# Patient Record
Sex: Female | Born: 1980
Health system: Southern US, Community
[De-identification: ages and names within clinical notes are randomized; demographics above are authoritative.]

## PROBLEM LIST (undated history)

## (undated) DIAGNOSIS — Z975 Presence of (intrauterine) contraceptive device: Secondary | ICD-10-CM

## (undated) DIAGNOSIS — A599 Trichomoniasis, unspecified: Secondary | ICD-10-CM

## (undated) DIAGNOSIS — E669 Obesity, unspecified: Secondary | ICD-10-CM

## (undated) DIAGNOSIS — R768 Other specified abnormal immunological findings in serum: Secondary | ICD-10-CM

## (undated) DIAGNOSIS — E559 Vitamin D deficiency, unspecified: Secondary | ICD-10-CM

## (undated) DIAGNOSIS — R51 Headache: Secondary | ICD-10-CM

## (undated) DIAGNOSIS — N898 Other specified noninflammatory disorders of vagina: Secondary | ICD-10-CM

## (undated) DIAGNOSIS — Z8619 Personal history of other infectious and parasitic diseases: Secondary | ICD-10-CM

## (undated) HISTORY — DX: Obesity, unspecified: E66.9

## (undated) HISTORY — DX: Headache: R51

## (undated) HISTORY — DX: Personal history of other infectious and parasitic diseases: Z86.19

## (undated) HISTORY — DX: Presence of (intrauterine) contraceptive device: Z97.5

## (undated) HISTORY — DX: Vitamin D deficiency, unspecified: E55.9

## (undated) HISTORY — DX: Other specified noninflammatory disorders of vagina: N89.8

## (undated) HISTORY — DX: Trichomoniasis, unspecified: A59.9

## (undated) HISTORY — DX: Other specified abnormal immunological findings in serum: R76.8

## (undated) HISTORY — PX: TONSILLECTOMY: SUR1361

---
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
## 2002-06-08 ENCOUNTER — Emergency Department (HOSPITAL_COMMUNITY): Admission: EM | Admit: 2002-06-08 | Discharge: 2002-06-08 | Payer: Self-pay | Admitting: Emergency Medicine

## 2002-06-08 ENCOUNTER — Encounter: Payer: Self-pay | Admitting: Emergency Medicine

## 2003-08-19 IMAGING — CT CT ABDOMEN W/ CM
1 of 2 series · 15 of 32 positions shown, 20 images · IV contrast (omnipaque)
Comparison: none

FINDINGS
CLINICAL DATA: ABDOMINAL PAIN TIMES TWO WEEKS.
CT ABDOMEN WITH CONTRAST
ROUTINE CT SCAN OF THE ABDOMEN AND PELVIS WAS PERFORMED FOLLOWING ORAL CONTRAST ADMINISTRATION AND
DURING INFUSION OF 100 CC OMNIPAQUE 300, IV.
NO ABNORMALITIES ARE SEEN IN THE LUNG BASES.
THE LIVER, GALLBLADDER, ADRENAL GLANDS, PANCREAS, SPLEEN, AND KIDNEYS ARE UNREMARKABLE IN
APPEARANCE.  THERE IS NO ENLARGED RETROPERITONEAL LYMPHADENOPATHY OR ASCITES DETECTED.  VISUALIZED
BOWEL LOOPS ARE NORMAL.
IMPRESSION
NEGATIVE CT SCAN OF THE ABDOMEN.
CT PELVIS WITH CONTRAST
THE UTERUS AND ADNEXAL STRUCTURES ARE UNREMARKABLE IN APPEARANCE.  THERE ARE NO SOFT TISSUE MASSES
OR ABNORMAL FLUID COLLECTIONS.  VISUALIZED BOWEL IS UNREMARKABLE IN APPEARANCE.  APPENDIX IS
VISUALIZED.
NEGATIVE CT SCAN OF THE PELVIS.

[Series 1367: — · axial · 0.60mm/px · z∈[+1415,+1815]mm · 15 of 89 slices shown, 20 images]
[im 5/89  soft-tissue]
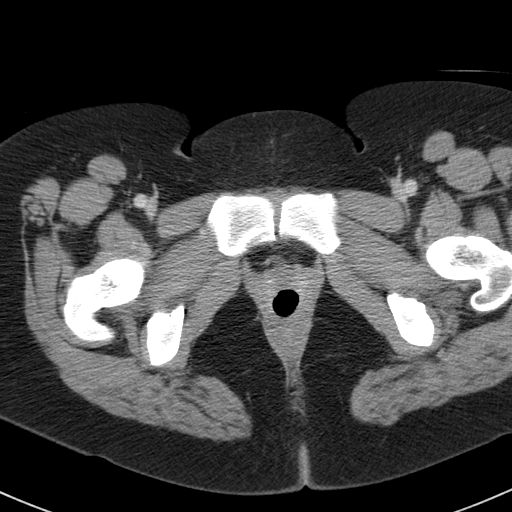
[im 5/89  bone]
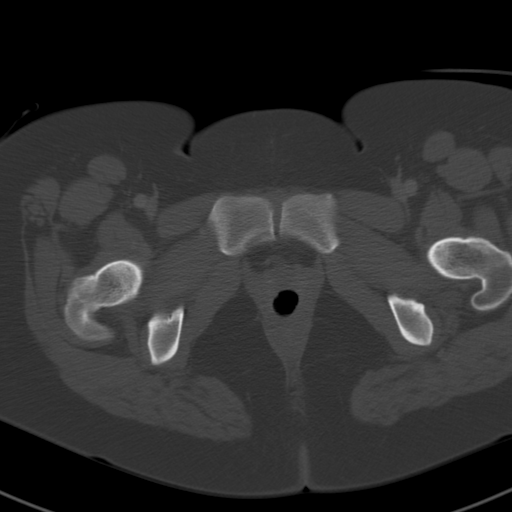
[im 13/89  soft-tissue]
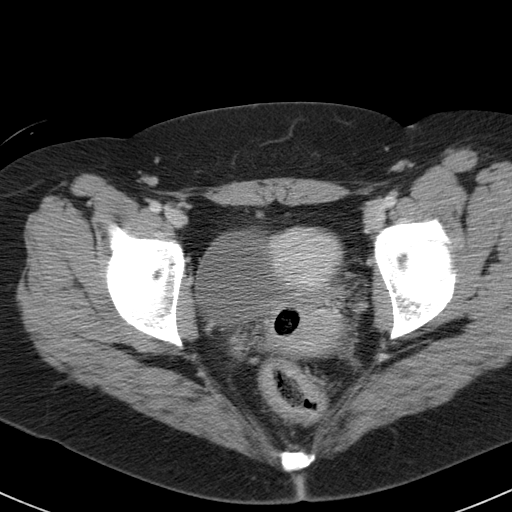
[im 17/89  soft-tissue]
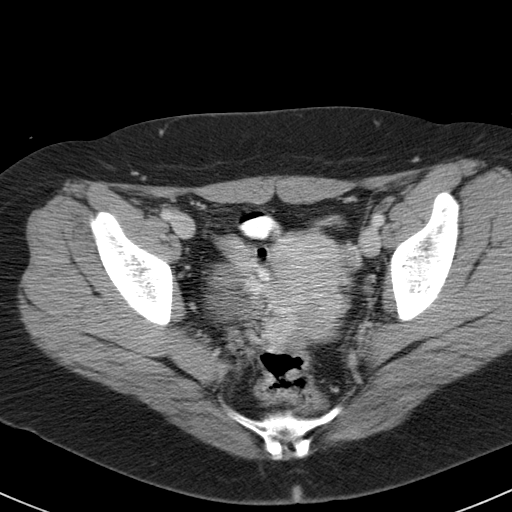
[im 25/89  soft-tissue]
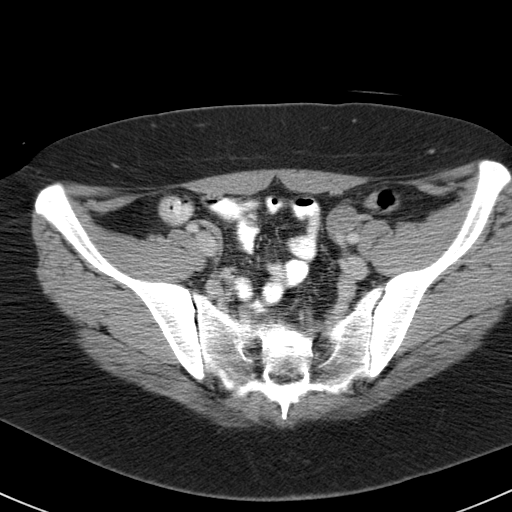
[im 29/89  soft-tissue]
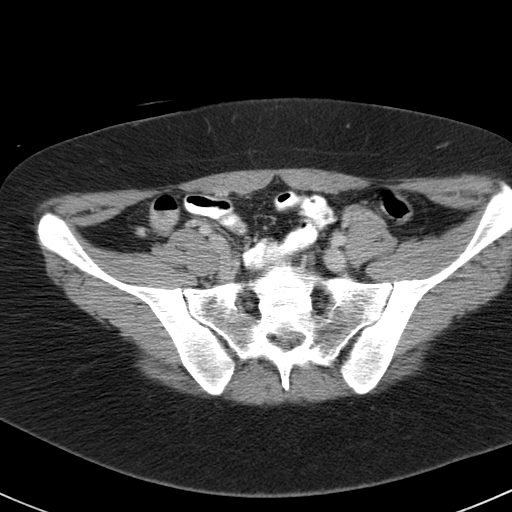
[im 37/89  soft-tissue]
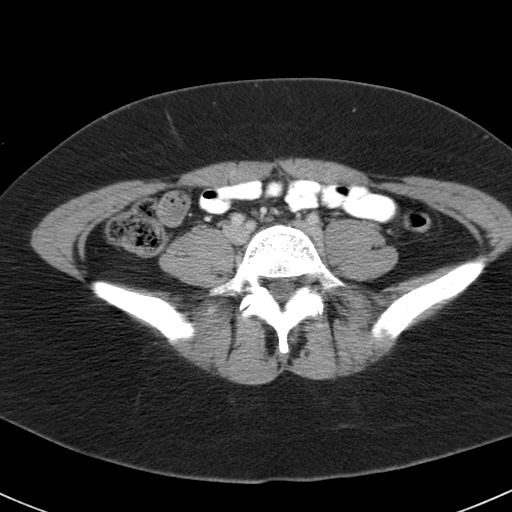
[im 41/89  soft-tissue]
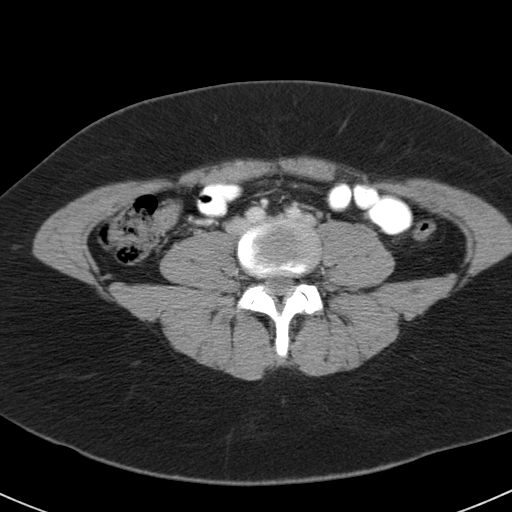
[im 49/89  soft-tissue]
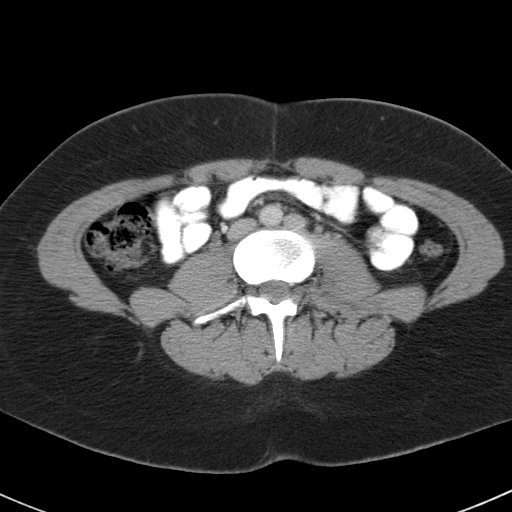
[im 53/89  soft-tissue]
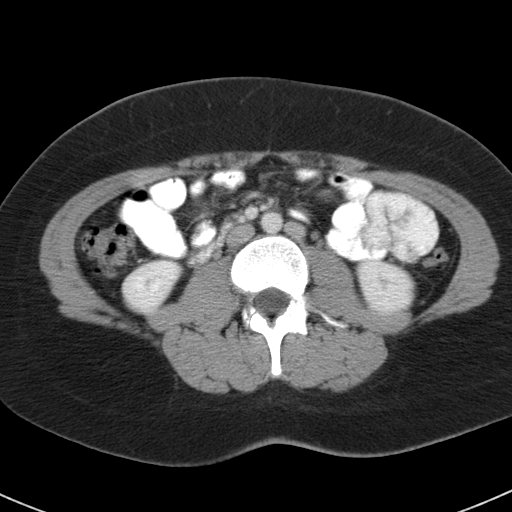
[im 53/89  bone]
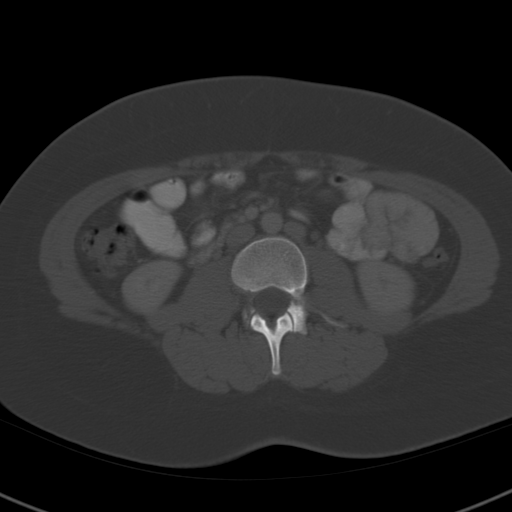
[im 61/89  soft-tissue]
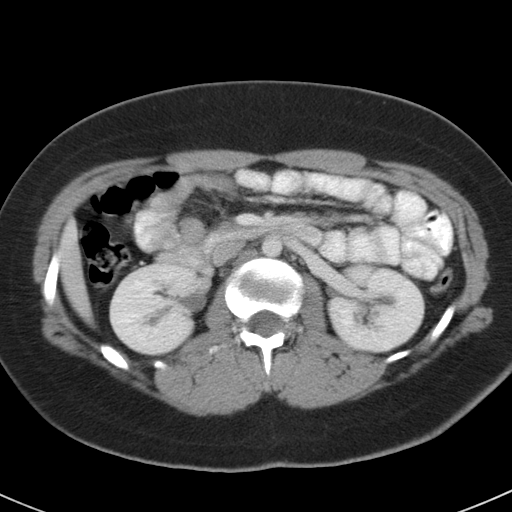
[im 65/89  soft-tissue]
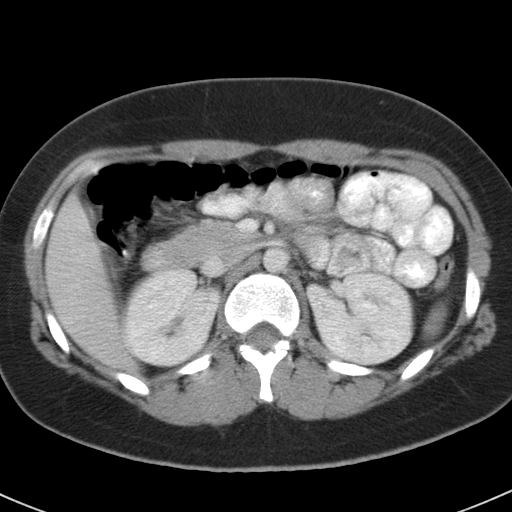
[im 73/89  soft-tissue]
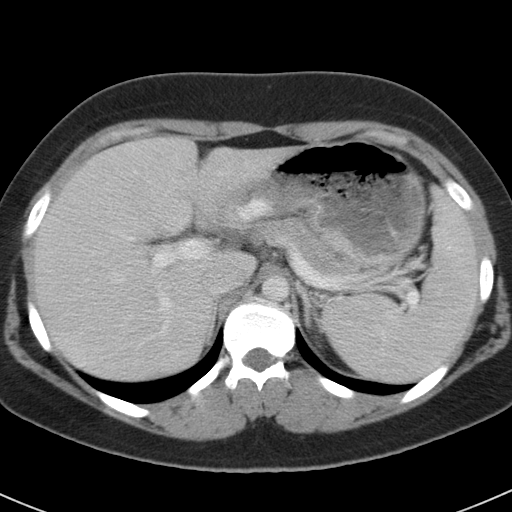
[im 73/89  lung]
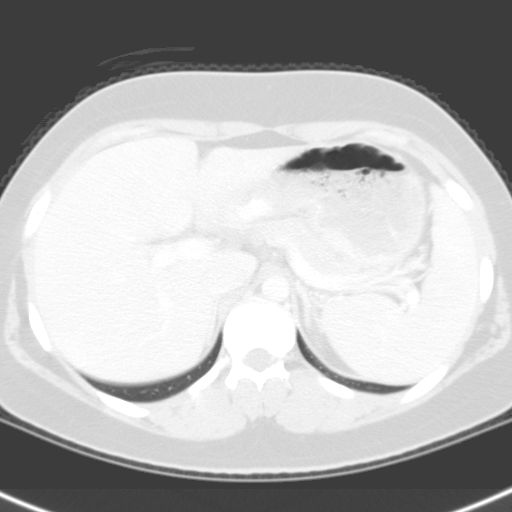
[im 77/89  soft-tissue]
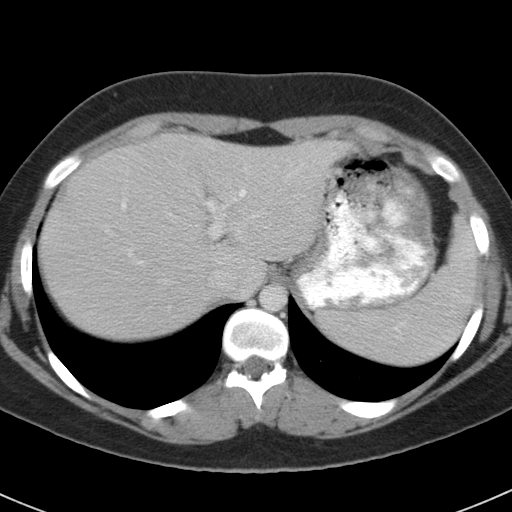
[im 77/89  lung]
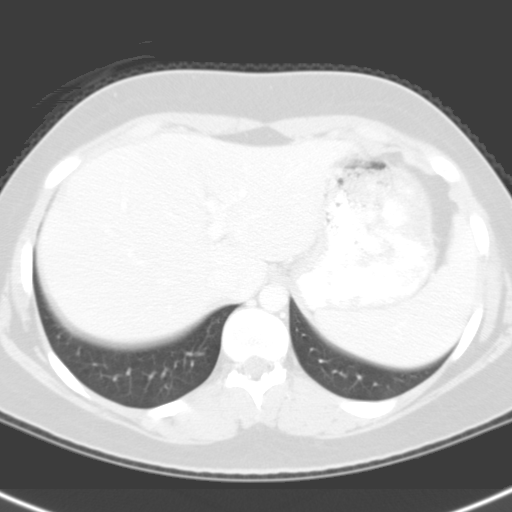
[im 81/89  lung]
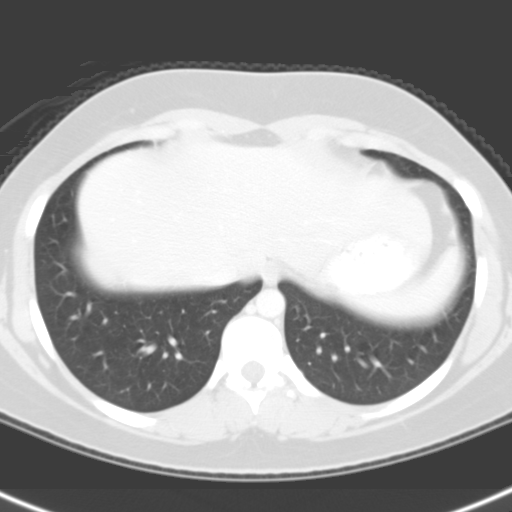
[im 85/89  soft-tissue]
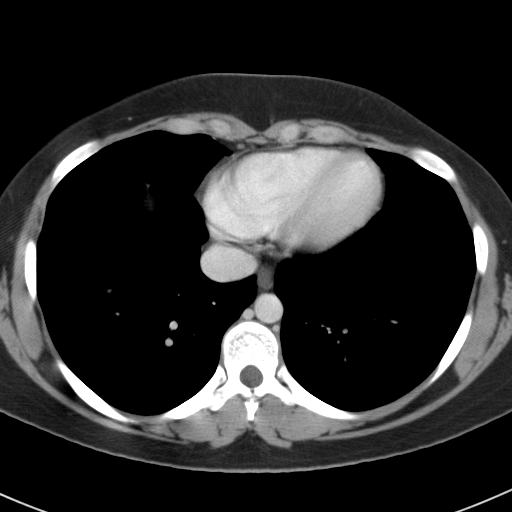
[im 85/89  lung]
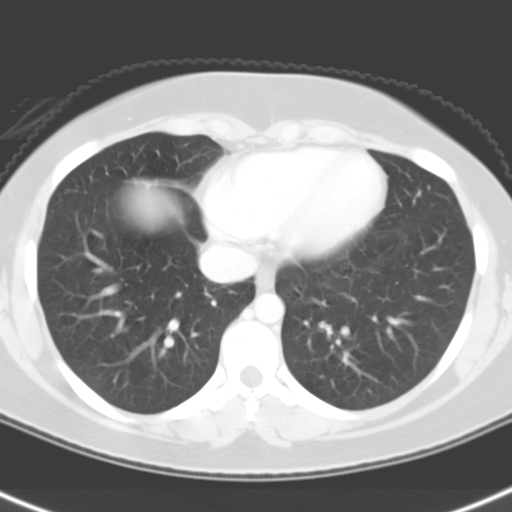

[15 of 32 positions shown; findings below may reference images not displayed]

## 2003-09-08 ENCOUNTER — Ambulatory Visit (HOSPITAL_COMMUNITY): Admission: EM | Admit: 2003-09-08 | Discharge: 2003-09-08 | Payer: Self-pay | Admitting: Emergency Medicine

## 2003-09-11 ENCOUNTER — Ambulatory Visit (HOSPITAL_COMMUNITY): Admission: RE | Admit: 2003-09-11 | Discharge: 2003-09-11 | Payer: Self-pay | Admitting: Obstetrics and Gynecology

## 2003-09-25 ENCOUNTER — Ambulatory Visit (HOSPITAL_COMMUNITY): Admission: AD | Admit: 2003-09-25 | Discharge: 2003-09-25 | Payer: Self-pay | Admitting: Obstetrics & Gynecology

## 2003-10-09 ENCOUNTER — Ambulatory Visit (HOSPITAL_COMMUNITY): Admission: AD | Admit: 2003-10-09 | Discharge: 2003-10-09 | Payer: Self-pay | Admitting: Obstetrics and Gynecology

## 2003-10-16 ENCOUNTER — Ambulatory Visit (HOSPITAL_COMMUNITY): Admission: AD | Admit: 2003-10-16 | Discharge: 2003-10-16 | Payer: Self-pay | Admitting: Obstetrics and Gynecology

## 2003-10-23 ENCOUNTER — Inpatient Hospital Stay (HOSPITAL_COMMUNITY): Admission: AD | Admit: 2003-10-23 | Discharge: 2003-10-26 | Payer: Self-pay | Admitting: Obstetrics & Gynecology

## 2011-10-14 ENCOUNTER — Other Ambulatory Visit (HOSPITAL_COMMUNITY)
Admission: RE | Admit: 2011-10-14 | Discharge: 2011-10-14 | Disposition: A | Discharge status: Home / Self Care | Payer: BC Managed Care – PPO | Source: Ambulatory Visit | Attending: Obstetrics and Gynecology | Admitting: Obstetrics and Gynecology

## 2011-10-14 DIAGNOSIS — Z01419 Encounter for gynecological examination (general) (routine) without abnormal findings: Secondary | ICD-10-CM | POA: Insufficient documentation

## 2011-10-14 DIAGNOSIS — Z113 Encounter for screening for infections with a predominantly sexual mode of transmission: Secondary | ICD-10-CM | POA: Insufficient documentation

## 2011-11-04 DIAGNOSIS — Z975 Presence of (intrauterine) contraceptive device: Secondary | ICD-10-CM

## 2011-11-04 HISTORY — DX: Presence of (intrauterine) contraceptive device: Z97.5

## 2012-09-27 ENCOUNTER — Other Ambulatory Visit: Payer: Self-pay | Admitting: Obstetrics & Gynecology

## 2012-09-27 DIAGNOSIS — N949 Unspecified condition associated with female genital organs and menstrual cycle: Secondary | ICD-10-CM

## 2012-10-21 ENCOUNTER — Encounter: Payer: Self-pay | Admitting: *Deleted

## 2012-10-21 DIAGNOSIS — J45909 Unspecified asthma, uncomplicated: Secondary | ICD-10-CM

## 2012-10-21 DIAGNOSIS — G43909 Migraine, unspecified, not intractable, without status migrainosus: Secondary | ICD-10-CM

## 2012-10-22 ENCOUNTER — Other Ambulatory Visit: Payer: Self-pay

## 2012-10-22 ENCOUNTER — Ambulatory Visit: Payer: Self-pay | Admitting: Adult Health

## 2012-10-29 ENCOUNTER — Other Ambulatory Visit: Payer: Self-pay

## 2014-04-24 ENCOUNTER — Ambulatory Visit (HOSPITAL_COMMUNITY)
Admission: RE | Admit: 2014-04-24 | Discharge: 2014-04-24 | Disposition: A | Discharge status: Home / Self Care | Payer: BC Managed Care – PPO | Source: Ambulatory Visit | Attending: Internal Medicine | Admitting: Internal Medicine

## 2014-04-24 ENCOUNTER — Other Ambulatory Visit (HOSPITAL_COMMUNITY): Payer: Self-pay | Admitting: Internal Medicine

## 2014-04-24 DIAGNOSIS — R918 Other nonspecific abnormal finding of lung field: Secondary | ICD-10-CM | POA: Insufficient documentation

## 2014-04-24 DIAGNOSIS — R0602 Shortness of breath: Secondary | ICD-10-CM

## 2014-05-22 ENCOUNTER — Encounter: Payer: Self-pay | Admitting: *Deleted

## 2014-09-19 ENCOUNTER — Other Ambulatory Visit (HOSPITAL_COMMUNITY): Payer: Self-pay | Admitting: Internal Medicine

## 2014-09-19 ENCOUNTER — Encounter (HOSPITAL_COMMUNITY): Payer: Self-pay

## 2014-09-19 ENCOUNTER — Emergency Department (HOSPITAL_COMMUNITY)
Admission: EM | Admit: 2014-09-19 | Discharge: 2014-09-20 | Disposition: A | Discharge status: Home / Self Care | Payer: BLUE CROSS/BLUE SHIELD | Attending: Emergency Medicine | Admitting: Emergency Medicine

## 2014-09-19 ENCOUNTER — Emergency Department (HOSPITAL_COMMUNITY): Payer: BLUE CROSS/BLUE SHIELD

## 2014-09-19 DIAGNOSIS — Z87891 Personal history of nicotine dependence: Secondary | ICD-10-CM | POA: Insufficient documentation

## 2014-09-19 DIAGNOSIS — Z8619 Personal history of other infectious and parasitic diseases: Secondary | ICD-10-CM | POA: Insufficient documentation

## 2014-09-19 DIAGNOSIS — Z880 Allergy status to penicillin: Secondary | ICD-10-CM | POA: Insufficient documentation

## 2014-09-19 DIAGNOSIS — R079 Chest pain, unspecified: Secondary | ICD-10-CM | POA: Diagnosis present

## 2014-09-19 DIAGNOSIS — R0602 Shortness of breath: Secondary | ICD-10-CM | POA: Diagnosis not present

## 2014-09-19 DIAGNOSIS — R112 Nausea with vomiting, unspecified: Secondary | ICD-10-CM

## 2014-09-19 LAB — I-STAT CHEM 8, ED
BUN: 10 mg/dL (ref 6–23)
CALCIUM ION: 1.26 mmol/L — AB (ref 1.12–1.23)
Chloride: 106 mmol/L (ref 96–112)
Creatinine, Ser: 0.9 mg/dL (ref 0.50–1.10)
GLUCOSE: 115 mg/dL — AB (ref 70–99)
HEMATOCRIT: 42 % (ref 36.0–46.0)
HEMOGLOBIN: 14.3 g/dL (ref 12.0–15.0)
Potassium: 3.4 mmol/L — ABNORMAL LOW (ref 3.5–5.1)
Sodium: 143 mmol/L (ref 135–145)
TCO2: 20 mmol/L (ref 0–100)

## 2014-09-19 MED ORDER — ASPIRIN 81 MG PO CHEW
324.0000 mg | CHEWABLE_TABLET | Freq: Once | ORAL | Status: AC
  Administered 2014-09-19: 324 mg via ORAL
  Filled 2014-09-19: qty 4

## 2014-09-19 NOTE — ED Provider Notes (Signed)
CSN: [id number]     Arrival date & time 09/19/14  2255 History  This chart was scribed for Joya Gaskins, MD by Richarda Overlie, ED Scribe. This patient was seen in room APA04/APA04 and the patient's care was started 11:20 PM.  Chief Complaint  Patient presents with  . Chest Pain   Patient is a 34 y.o. female presenting with chest pain. The history is provided by the patient. No language interpreter was used.  Chest Pain Pain location:  Unable to specify Pain quality: tightness   Pain radiates to the back: no   Pain severity:  Mild Onset quality:  Sudden Duration:  6 hours Progression:  Worsening Chronicity:  New Relieved by:  Nothing Worsened by:  Nothing tried Ineffective treatments:  None tried Associated symptoms: diaphoresis and shortness of breath   Associated symptoms: no fever    HPI Comments: Monica Horton is a 34 y.o. female who presents to the Emergency Department complaining of worsening chest heaviness that started a little earlier than 6PM tonight. Pt states that her "heart started feeling funny." She says that she broke out in a sweat, was SOB and vomited during onset. Pt reports leg swelling that his been occurring since last October. Pt reports she has taken no medication today. She states that she had pneumonia last fall and has had some health issues since that time. Pt reports no history of blood clot in her legs or lungs.  Pt denies any recent surgeries or travel. Pt reports she is on mirena. She is a current nonsmoker.  She denies fever and abdominal pain.    Fam history - negative for premature CAD  Past Medical History  Diagnosis Date  . Headache(784.0)   . Hx of trichomoniasis    Past Surgical History  Procedure Laterality Date  . Tonsillectomy     Family History  Problem Relation Age of Onset  . Cancer Mother     uterine  . Stroke Maternal Grandmother   . Cancer Maternal Grandfather     lung  . Stroke Maternal Grandfather   . Stroke Paternal  Grandmother   . Stroke Paternal Grandfather   . Cancer Paternal Grandfather     throat   History  Substance Use Topics  . Smoking status: Former Games developer  . Smokeless tobacco: Not on file  . Alcohol Use: No     Comment: occ   OB History    Gravida Para Term Preterm AB TAB SAB Ectopic Multiple Living   Review of Systems  Constitutional: Positive for diaphoresis. Negative for fever.  Respiratory: Positive for chest tightness and shortness of breath.   Cardiovascular: Positive for chest pain.  All other systems reviewed and are negative.   Allergies  Biaxin and Penicillins  Home Medications   Prior to Admission medications   Medication Sig Start Date End Date Taking? Authorizing Provider  levonorgestrel (MIRENA) 20 MCG/24HR IUD 1 each by Intrauterine route once.   Yes Historical Provider, MD  Naproxen Sodium (ALEVE PO) Take by mouth.    Historical Provider, MD   BP 121/78 mmHg  Pulse 84  Temp(Src) 97.7 F (36.5 C) (Oral)  Resp 20  Ht  (1.6 m)  Wt 250 lb (113.399 kg)  BMI 44.30 kg/m2  SpO2 99%  LMP 09/17/2014 Physical Exam CONSTITUTIONAL: Well developed/well nourished HEAD: Normocephalic/atraumatic EYES: EOMI/PERRL ENMT: Mucous membranes moist NECK: supple no meningeal signs SPINE/BACK:entire spine  nontender CV: S1/S2 noted, no murmurs/rubs/gallops noted LUNGS: Lungs are clear to auscultation bilaterally, no apparent distress ABDOMEN: soft, nontender, no rebound or guarding, bowel sounds noted throughout abdomen GU:no cva tenderness NEURO: Pt is awake/alert/appropriate, moves all extremitiesx4.  No facial droop.   EXTREMITIES: pulses normal/equal, full ROM. No calf tenderness.  SKIN: warm, color normal PSYCH: no abnormalities of mood noted, alert and oriented to situation  ED Course  Procedures   DIAGNOSTIC STUDIES: Oxygen Saturation is 99% on RA, normal by my interpretation.    COORDINATION OF CARE: 11:27 PM Discussed treatment plan  with pt at bedside and pt agreed to plan. Pt well appearing HEART Score less than 3 I doubt PE (no pleuritic pain, no hypoxia) Will recheck troponin after 3 hrs Pt is feeling improved   No ekg changes Repeat troponin negative Pt feels improved Will d/c home  Discussed strict return precautions  Labs Review Labs Reviewed  I-STAT CHEM 8, ED - Abnormal; Notable for the following:    Potassium 3.4 (*)    Glucose, Bld 115 (*)    Calcium, Ion 1.26 (*)    All other components within normal limits  I-STAT TROPOININ, ED  I-STAT TROPOININ, ED    Imaging Review Dg Chest 2 View  09/20/2014   CLINICAL DATA:  Acute onset of central chest heaviness, radiating to the upper back. Initial encounter.  EXAM: CHEST  2 VIEW  COMPARISON:  Chest radiograph performed 04/24/2014  FINDINGS: The lungs are well-aerated and clear. There is no evidence of focal opacification, pleural effusion or pneumothorax.  The heart is normal in size; the mediastinal contour is within normal limits. No acute osseous abnormalities are seen.  IMPRESSION: No acute cardiopulmonary process seen.   Electronically Signed   By: Roanna RaiderJeffery  Chang M.D.   On: 09/20/2014 00:57     EKG Interpretation   Date/Time:  Tuesday September 19 2014 23:07:22 EST Ventricular Rate:  79 PR Interval:  171 QRS Duration: 87 QT Interval:  363 QTC Calculation: 416 R Axis:   47 Text Interpretation:  Sinus rhythm Low voltage, precordial leads ECG  OTHERWISE WITHIN NORMAL LIMITS No previous ECGs available Confirmed by  Bebe ShaggyWICKLINE  MD, Staphany Ditton ([id number]) on 09/19/2014 11:19:02 PM     Medications  aspirin chewable tablet 324 mg (324 mg Oral Given 09/19/14 2333)    EKG Interpretation  Date/Time:  Wednesday September 20 2014 02:55:05 EST Ventricular Rate:  86 PR Interval:  172 QRS Duration: 83 QT Interval:  368 QTC Calculation: 440 R Axis:   28 Text Interpretation:  Sinus rhythm Low voltage, precordial leads No significant change since last tracing Confirmed by  Bebe ShaggyWICKLINE  MD, Dorinda HillNALD ([id number]) on 09/20/2014 3:13:28 AM       MDM   Final diagnoses:  Chest pain, unspecified chest pain type    Nursing notes including past medical history and social history reviewed and considered in documentation xrays/imaging reviewed by myself and considered during evaluation Labs/vital reviewed myself and considered during evaluation   I personally performed the services described in this documentation, which was scribed in my presence. The recorded information has been reviewed and is accurate.       Joya Gaskinsonald W Taj Arteaga, MD 09/20/14 [id number]

## 2014-09-19 NOTE — ED Notes (Signed)
Pt c/o heaviness in her chest that started earlier this evening.  Pt states the heaviness has worsened since then.  Pt states pain goes through to her back

## 2014-09-20 LAB — I-STAT TROPONIN, ED
Troponin i, poc: 0 ng/mL (ref 0.00–0.08)
Troponin i, poc: 0 ng/mL (ref 0.00–0.08)
Troponin i, poc: 0 ng/mL (ref 0.00–0.08)

## 2014-09-20 NOTE — Discharge Instructions (Signed)

## 2014-09-20 NOTE — ED Notes (Signed)
Pt relaxed and cooperative at this time.  Denies needs at present.

## 2014-09-22 ENCOUNTER — Ambulatory Visit (HOSPITAL_COMMUNITY)
Admission: RE | Admit: 2014-09-22 | Discharge: 2014-09-22 | Disposition: A | Discharge status: Home / Self Care | Payer: BLUE CROSS/BLUE SHIELD | Source: Ambulatory Visit | Attending: Internal Medicine | Admitting: Internal Medicine

## 2014-09-22 DIAGNOSIS — R112 Nausea with vomiting, unspecified: Secondary | ICD-10-CM | POA: Diagnosis not present

## 2015-04-09 ENCOUNTER — Encounter: Payer: Self-pay | Admitting: Family Medicine

## 2015-04-09 ENCOUNTER — Ambulatory Visit (INDEPENDENT_AMBULATORY_CARE_PROVIDER_SITE_OTHER): Payer: BLUE CROSS/BLUE SHIELD | Admitting: Family Medicine

## 2015-04-09 VITALS — BP 122/82 | Temp 98.5°F | Wt 304.0 lb

## 2015-04-09 DIAGNOSIS — R1011 Right upper quadrant pain: Secondary | ICD-10-CM

## 2015-04-09 MED ORDER — ONDANSETRON 4 MG PO TBDP: 4.0000 mg | ORAL_TABLET | Freq: Three times a day (TID) | ORAL | Status: DC | PRN

## 2015-04-09 MED ORDER — PANTOPRAZOLE SODIUM 40 MG PO TBEC: 40.0000 mg | DELAYED_RELEASE_TABLET | Freq: Every day | ORAL | Status: DC

## 2015-04-09 NOTE — Progress Notes (Signed)
   Subjective:    Patient ID: Monica Horton, female    DOB: 1981/02/26, 34 y.o.   MRN: [id number] Patient arrives office as a new patient. Abdominal Pain This is a new problem. The current episode started 1 to 4 weeks ago. Associated symptoms include diarrhea, headaches and vomiting. Associated symptoms comments: cough. Treatments tried: alleve.   Had h a early on on one side of the head, with history of migraine headaches. Also diffuse lumbar pain.  Gets them off and on, some serious.   Pain on right side, feels better with pressure on it, often a deep ache. Often fairly severe can last an hour or more. Often after a meal.  Saw an internist earlier this year. Had a workup. Negative ultrasound. Was advised that it was not her gallbladder at that point.  Sig nausea and vom, "not digested"  Has lost over 100 pounds on her own,   Certain foods or heavy meals make it worse. Milk with dinner was bad.    Some reflux symptoms at times.  Patient gives history of      Review of Systems  Gastrointestinal: Positive for vomiting, abdominal pain and diarrhea.  Neurological: Positive for headaches.   No change in urinary or bowel habits    Objective:   Physical Exam Alert morbid obesity present. Vital stable HEENT normal. Lungs clear heart regular in rhythm no CVA tenderness. Discrete right upper quadrant tenderness to deep pressure bowel sounds intact       Assessment & Plan:  Impression recurrent and persistent right upper quadrant pain with negative ultrasound. Patient lost over 100 pounds which puts her increased risk for true gallstones discussed at length even with negative ultrasound further evaluation warranted #2 headaches #3 back pain #4 possible reflux plan initiate protonic. Nuclear medicine study further recommendations based results warning signs discussed carefully WSL

## 2015-04-11 ENCOUNTER — Encounter (HOSPITAL_COMMUNITY)
Admission: RE | Admit: 2015-04-11 | Discharge: 2015-04-11 | Disposition: A | Discharge status: Home / Self Care | Payer: BLUE CROSS/BLUE SHIELD | Source: Ambulatory Visit | Attending: Family Medicine | Admitting: Family Medicine

## 2015-04-11 ENCOUNTER — Encounter (HOSPITAL_COMMUNITY): Payer: Self-pay

## 2015-04-11 DIAGNOSIS — R1011 Right upper quadrant pain: Secondary | ICD-10-CM | POA: Diagnosis present

## 2015-04-11 MED ORDER — SINCALIDE 5 MCG IJ SOLR
INTRAMUSCULAR | Status: AC
  Administered 2015-04-11: 2.77 ug via INTRAVENOUS
  Filled 2015-04-11: qty 5

## 2015-04-11 MED ORDER — TECHNETIUM TC 99M MEBROFENIN IV KIT
5.0000 | PACK | Freq: Once | INTRAVENOUS | Status: DC | PRN
  Administered 2015-04-11: 5 via INTRAVENOUS
  Filled 2015-04-11: qty 6

## 2015-04-11 MED ORDER — STERILE WATER FOR INJECTION IJ SOLN
INTRAMUSCULAR | Status: AC
  Administered 2015-04-11: 5 mL via INTRAVENOUS
  Filled 2015-04-11: qty 10

## 2015-04-12 ENCOUNTER — Encounter: Payer: Self-pay | Admitting: Family Medicine

## 2015-04-12 NOTE — Addendum Note (Signed)
Addended by: Margaretha Sheffield on: 04/12/2015 10:47 AM   Modules accepted: Orders

## 2015-04-25 ENCOUNTER — Encounter (INDEPENDENT_AMBULATORY_CARE_PROVIDER_SITE_OTHER): Payer: Self-pay | Admitting: *Deleted

## 2015-05-23 ENCOUNTER — Ambulatory Visit (INDEPENDENT_AMBULATORY_CARE_PROVIDER_SITE_OTHER): Payer: BLUE CROSS/BLUE SHIELD | Admitting: Internal Medicine

## 2015-05-28 ENCOUNTER — Encounter (INDEPENDENT_AMBULATORY_CARE_PROVIDER_SITE_OTHER): Payer: Self-pay | Admitting: *Deleted

## 2015-06-21 ENCOUNTER — Ambulatory Visit (INDEPENDENT_AMBULATORY_CARE_PROVIDER_SITE_OTHER): Payer: BLUE CROSS/BLUE SHIELD | Admitting: Family Medicine

## 2015-06-21 ENCOUNTER — Encounter: Payer: Self-pay | Admitting: Family Medicine

## 2015-06-21 ENCOUNTER — Ambulatory Visit: Payer: BLUE CROSS/BLUE SHIELD | Admitting: Family Medicine

## 2015-06-21 VITALS — BP 124/88 | Temp 98.1°F | Ht 63.0 in | Wt 301.5 lb

## 2015-06-21 DIAGNOSIS — J019 Acute sinusitis, unspecified: Secondary | ICD-10-CM | POA: Diagnosis not present

## 2015-06-21 DIAGNOSIS — G43009 Migraine without aura, not intractable, without status migrainosus: Secondary | ICD-10-CM

## 2015-06-21 DIAGNOSIS — B9689 Other specified bacterial agents as the cause of diseases classified elsewhere: Secondary | ICD-10-CM

## 2015-06-21 MED ORDER — LEVOFLOXACIN 500 MG PO TABS: 500.0000 mg | ORAL_TABLET | Freq: Every day | ORAL | Status: DC

## 2015-06-21 MED ORDER — RIZATRIPTAN BENZOATE 10 MG PO TABS: 10.0000 mg | ORAL_TABLET | ORAL | Status: DC | PRN

## 2015-06-21 MED ORDER — ONDANSETRON HCL 8 MG PO TABS: 8.0000 mg | ORAL_TABLET | Freq: Three times a day (TID) | ORAL | Status: DC | PRN

## 2015-06-21 MED ORDER — ALBUTEROL SULFATE HFA 108 (90 BASE) MCG/ACT IN AERS: 2.0000 | INHALATION_SPRAY | RESPIRATORY_TRACT | Status: DC | PRN

## 2015-06-21 NOTE — Progress Notes (Signed)
   Subjective:    Patient ID: Monica Horton, female    DOB: Dec 04, 1980, 34 y.o.   MRN: [id number]  Sinusitis This is a new problem. The current episode started in the past 7 days. The problem has been gradually worsening since onset. The maximum temperature recorded prior to her arrival was 102 - 102.9 F. Associated symptoms include congestion, coughing, ear pain, headaches, sneezing and a sore throat. Pertinent negatives include no shortness of breath. (Runny nose, productive cough with brown/ blood tinged sputum.) Treatments tried: OTC cough suppresant.   Intermittent migraines over the past year 2 in the past 8 weeks throbbing slight nausea no vomiting or diarrhea denies double vision. Patient states that it's a throbbing left side of the head does not wake her up at night typically   Review of Systems  Constitutional: Negative for fever and activity change.  HENT: Positive for congestion, ear pain, rhinorrhea, sneezing and sore throat.   Eyes: Negative for discharge.  Respiratory: Positive for cough. Negative for shortness of breath and wheezing.   Cardiovascular: Negative for chest pain.  Neurological: Positive for headaches.       Objective:   Physical Exam  Constitutional: She appears well-developed.  HENT:  Head: Normocephalic.  Nose: Nose normal.  Mouth/Throat: Oropharynx is clear and moist. No oropharyngeal exudate.  Neck: Neck supple.  Cardiovascular: Normal rate and normal heart sounds.   No murmur heard. Pulmonary/Chest: Effort normal and breath sounds normal. She has no wheezes.  Lymphadenopathy:    She has no cervical adenopathy.  Skin: Skin is warm and dry.  Nursing note and vitals reviewed.         Assessment & Plan:  Sinusitis antibiotics prescribed warning signs discussed I believe the patient has some underlying parainfluenza virus I believe is also triggering a sinus infection should gradually get better with antibiotics  Migraines-medications were  written for abortive measures if frequent migraines follow-up for discussion of daily medication. Such as Topamax

## 2015-07-10 ENCOUNTER — Other Ambulatory Visit: Payer: Self-pay | Admitting: Family Medicine

## 2015-07-11 ENCOUNTER — Other Ambulatory Visit: Payer: Self-pay | Admitting: Nurse Practitioner

## 2015-07-11 ENCOUNTER — Telehealth: Payer: Self-pay | Admitting: Family Medicine

## 2015-07-11 MED ORDER — RIZATRIPTAN BENZOATE 10 MG PO TABS: 10.0000 mg | ORAL_TABLET | ORAL | Status: DC | PRN

## 2015-07-11 NOTE — Telephone Encounter (Signed)
Patient's Rx was denied due to "fill too soon" (spoke with patient, she still has a couple and feels she'll be ok until 07/18/15)  Patient's plan allows #12 tablets every 25 days  Patient's insurance will terminate 07/21/2015  Can we order rizatriptan (MAXALT) 10 MG tablet, #12 tablets/25 days and add to comment "May fill 07/18/2015" (this is her next available fill date per insurance criteria)   [facility]

## 2015-07-11 NOTE — Telephone Encounter (Signed)
Patient's Rx was denied due to "fill too soon"  Patient's plan allows #12 tablets every 25 days  Patient's insurance will terminate 07/21/2015  Can we order rizatriptan (MAXALT) 10 MG tablet, #12 tablets/25 days and add to comment "May fill 07/18/2015" (this is her next available fill date per insurance criteria)

## 2015-07-11 NOTE — Telephone Encounter (Signed)
done

## 2016-01-17 ENCOUNTER — Other Ambulatory Visit (HOSPITAL_COMMUNITY)
Admission: RE | Admit: 2016-01-17 | Discharge: 2016-01-17 | Disposition: A | Discharge status: Home / Self Care | Payer: BLUE CROSS/BLUE SHIELD | Source: Ambulatory Visit | Attending: Adult Health | Admitting: Adult Health

## 2016-01-17 ENCOUNTER — Ambulatory Visit (INDEPENDENT_AMBULATORY_CARE_PROVIDER_SITE_OTHER): Payer: BLUE CROSS/BLUE SHIELD | Admitting: Adult Health

## 2016-01-17 ENCOUNTER — Encounter: Payer: Self-pay | Admitting: Adult Health

## 2016-01-17 VITALS — BP 130/80 | HR 82 | Ht 64.0 in | Wt 314.5 lb

## 2016-01-17 DIAGNOSIS — Z1151 Encounter for screening for human papillomavirus (HPV): Secondary | ICD-10-CM | POA: Diagnosis not present

## 2016-01-17 DIAGNOSIS — N898 Other specified noninflammatory disorders of vagina: Secondary | ICD-10-CM

## 2016-01-17 DIAGNOSIS — R1032 Left lower quadrant pain: Secondary | ICD-10-CM | POA: Diagnosis not present

## 2016-01-17 DIAGNOSIS — A599 Trichomoniasis, unspecified: Secondary | ICD-10-CM | POA: Diagnosis not present

## 2016-01-17 DIAGNOSIS — Z113 Encounter for screening for infections with a predominantly sexual mode of transmission: Secondary | ICD-10-CM | POA: Insufficient documentation

## 2016-01-17 DIAGNOSIS — Z01411 Encounter for gynecological examination (general) (routine) with abnormal findings: Secondary | ICD-10-CM

## 2016-01-17 DIAGNOSIS — Z01419 Encounter for gynecological examination (general) (routine) without abnormal findings: Secondary | ICD-10-CM | POA: Diagnosis present

## 2016-01-17 HISTORY — DX: Other specified noninflammatory disorders of vagina: N89.8

## 2016-01-17 HISTORY — DX: Trichomoniasis, unspecified: A59.9

## 2016-01-17 LAB — POCT WET PREP (WET MOUNT)
CLUE CELLS WET PREP WHIFF POC: POSITIVE
Trichomonas Wet Prep HPF POC: POSITIVE
WBC, Wet Prep HPF POC: POSITIVE

## 2016-01-17 MED ORDER — METRONIDAZOLE 500 MG PO TABS
ORAL_TABLET | ORAL | Status: DC
Start: 2016-01-17 — End: 2017-08-06

## 2016-01-17 NOTE — Progress Notes (Signed)
Patient ID: Monica Horton, female   DOB: 06-07-1981, 35 y.o.   MRN: [id number] History of Present Illness: Monica Horton is a 35 year old white female, in for well woman gyn exam and pap.She complains of vaginal discharge and pain in left side at times and feels sick after eating at times.She has IUD and no periods, was inserted 11/04/11. PCP is Monica Horton.   Current Medications, Allergies, Past Medical History, Past Surgical History, Family History and Social History were reviewed in Monica Horton [facility] electronic medical record.     Review of Systems: Patient denies any daily headaches, hearing loss, fatigue, blurred vision, shortness of breath, chest pain,  problems with bowel movements, urination, or intercourse. No joint pain or mood swings. See HPI for positives.   Physical Exam:BP 130/80 mmHg  Pulse 82  Ht 5\' 4"  (1.626 m)  Wt 314 lb 8 oz (142.656 kg)  BMI 53.96 kg/m2 General:  Well developed, well nourished, no acute distress Skin:  Warm and dry Neck:  Midline trachea, normal thyroid, good ROM, no lymphadenopathy Lungs; Clear to auscultation bilaterally Breast:  No dominant palpable mass, retraction, or nipple discharge, large wears 44 G. Cardiovascular: Regular rate and rhythm Abdomen:  Soft, non tender, no hepatosplenomegaly,obese Pelvic:  External genitalia is normal in appearance, no lesions.  The vagina is normal in appearance,has gray discharge with odor. Urethra has no lesions or masses. The cervix is bulbous,no IUD strings seen, pap with GC/CHL and HPV performed and wet prep was +WBC and + trich.  Uterus is felt to be normal size, shape, and contour.  No adnexal masses or tenderness noted.Bladder is non tender, no masses felt. Extremities/musculoskeletal:  No swelling or varicosities noted, no clubbing or cyanosis Psych:  No mood changes, alert and cooperative,seems happy   Impression: Well woman gyn exam and pap Vaginal discharge Trich LLQ pain  STD  screening     Plan: Check CBC,CMP,TSH and lipids,A1c and vitamin D,HIV,RPR and HSV 2 Return in 1 week for gyn US to check for IUD and assess LLQ pain Return in 2 weeks for proof of cure for trich Rx flagyl 500 mg # 4 take 4 po now, no alcohol and review handout on trich, also treated partner Monica Horton with same Physical in 1 year, pap in 3 if normal Return in April 2018 for IUD removal and reinsertion with Monica Horton

## 2016-01-17 NOTE — Patient Instructions (Signed)
Trichomoniasis Trichomoniasis is an infection caused by an organism called Trichomonas. The infection can affect both women and men. In women, the outer female genitalia and the vagina are affected. In men, the penis is mainly affected, but the prostate and other reproductive organs can also be involved. Trichomoniasis is a sexually transmitted infection (STI) and is most often passed to another person through sexual contact.  RISK FACTORS  Having unprotected sexual intercourse.  Having sexual intercourse with an infected partner. SIGNS AND SYMPTOMS  Symptoms of trichomoniasis in women include:  Abnormal gray-green frothy vaginal discharge.  Itching and irritation of the vagina.  Itching and irritation of the area outside the vagina. Symptoms of trichomoniasis in men include:   Penile discharge with or without pain.  Pain during urination. This results from inflammation of the urethra. DIAGNOSIS  Trichomoniasis may be found during a Pap test or physical exam. Your health care provider may use one of the following methods to help diagnose this infection:  Testing the pH of the vagina with a test tape.  Using a vaginal swab test that checks for the Trichomonas organism. A test is available that provides results within a few minutes.  Examining a urine sample.  Testing vaginal secretions. Your health care provider may test you for other STIs, including HIV. TREATMENT   You may be given medicine to fight the infection. Women should inform their health care provider if they could be or are pregnant. Some medicines used to treat the infection should not be taken during pregnancy.  Your health care provider may recommend over-the-counter medicines or creams to decrease itching or irritation.  Your sexual partner will need to be treated if infected.  Your health care provider may test you for infection again 3 months after treatment. HOME CARE INSTRUCTIONS   Take medicines only as  directed by your health care provider.  Take over-the-counter medicine for itching or irritation as directed by your health care provider.  Do not have sexual intercourse while you have the infection.  Women should not douche or wear tampons while they have the infection.  Discuss your infection with your partner. Your partner may have gotten the infection from you, or you may have gotten it from your partner.  Have your sex partner get examined and treated if necessary.  Practice safe, informed, and protected sex.  See your health care provider for other STI testing. SEEK MEDICAL CARE IF:   You still have symptoms after you finish your medicine.  You develop abdominal pain.  You have pain when you urinate.  You have bleeding after sexual intercourse.  You develop a rash.  Your medicine makes you sick or makes you throw up (vomit). MAKE SURE YOU:  Understand these instructions.  Will watch your condition.  Will get help right away if you are not doing well or get worse.   This information is not intended to replace advice given to you by your health care provider. Make sure you discuss any questions you have with your health care provider.   Document Released: 12/31/2000 Document Revised: 07/28/2014 Document Reviewed: 04/18/2013 Elsevier Interactive Patient Education [employer] Return in 1 week for US  Return in 2 weeks for POT with me Physical in 1 year,pap in 3 if normal Return in April 2018 for IUD removal and reinsertion

## 2016-01-18 LAB — LIPID PANEL
CHOL/HDL RATIO: 5.8 ratio — AB (ref 0.0–4.4)
Cholesterol, Total: 145 mg/dL (ref 100–199)
HDL: 25 mg/dL — ABNORMAL LOW (ref >39)
LDL CALC: 87 mg/dL (ref 0–99)
Triglycerides: 166 mg/dL — ABNORMAL HIGH (ref 0–149)
VLDL Cholesterol Cal: 33 mg/dL (ref 5–40)

## 2016-01-18 LAB — COMPREHENSIVE METABOLIC PANEL
A/G RATIO: 1.7 (ref 1.2–2.2)
ALT: 21 IU/L (ref 0–32)
AST: 17 IU/L (ref 0–40)
Albumin: 4.5 g/dL (ref 3.5–5.5)
Alkaline Phosphatase: 93 IU/L (ref 39–117)
BUN/Creatinine Ratio: 18 (ref 9–23)
BUN: 12 mg/dL (ref 6–20)
Bilirubin Total: 0.4 mg/dL (ref 0.0–1.2)
CO2: 22 mmol/L (ref 18–29)
Calcium: 9.1 mg/dL (ref 8.7–10.2)
Chloride: 102 mmol/L (ref 96–106)
Creatinine, Ser: 0.68 mg/dL (ref 0.57–1.00)
GFR, EST AFRICAN AMERICAN: 134 mL/min/{1.73_m2} (ref >59)
GFR, EST NON AFRICAN AMERICAN: 116 mL/min/{1.73_m2} (ref >59)
GLOBULIN, TOTAL: 2.6 g/dL (ref 1.5–4.5)
Glucose: 109 mg/dL — ABNORMAL HIGH (ref 65–99)
POTASSIUM: 4.6 mmol/L (ref 3.5–5.2)
SODIUM: 140 mmol/L (ref 134–144)
TOTAL PROTEIN: 7.1 g/dL (ref 6.0–8.5)

## 2016-01-18 LAB — CBC
Hematocrit: 43.7 % (ref 34.0–46.6)
Hemoglobin: 14.2 g/dL (ref 11.1–15.9)
MCH: 29 pg (ref 26.6–33.0)
MCHC: 32.5 g/dL (ref 31.5–35.7)
MCV: 89 fL (ref 79–97)
Platelets: 173 10*3/uL (ref 150–379)
RBC: 4.89 x10E6/uL (ref 3.77–5.28)
RDW: 13.5 % (ref 12.3–15.4)
WBC: 5.1 10*3/uL (ref 3.4–10.8)

## 2016-01-18 LAB — VITAMIN D 25 HYDROXY (VIT D DEFICIENCY, FRACTURES): VIT D 25 HYDROXY: 21.2 ng/mL — AB (ref 30.0–100.0)

## 2016-01-18 LAB — TSH: TSH: 2.74 u[IU]/mL (ref 0.450–4.500)

## 2016-01-18 LAB — HEMOGLOBIN A1C
ESTIMATED AVERAGE GLUCOSE: 105 mg/dL
HEMOGLOBIN A1C: 5.3 % (ref 4.8–5.6)

## 2016-01-18 LAB — SYPHILIS: RPR W/REFLEX TO RPR TITER AND TREPONEMAL ANTIBODIES, TRADITIONAL SCREENING AND DIAGNOSIS ALGORITHM: RPR Ser Ql: NONREACTIVE

## 2016-01-18 LAB — CYTOLOGY - PAP

## 2016-01-18 LAB — HSV 2 ANTIBODY, IGG: HSV 2 Glycoprotein G Ab, IgG: 18.3 index — ABNORMAL HIGH (ref 0.00–0.90)

## 2016-01-18 LAB — HIV ANTIBODY (ROUTINE TESTING W REFLEX): HIV Screen 4th Generation wRfx: NONREACTIVE

## 2016-01-21 ENCOUNTER — Telehealth: Payer: Self-pay | Admitting: Adult Health

## 2016-01-21 NOTE — Telephone Encounter (Signed)
Left message to call about labs 

## 2016-01-23 ENCOUNTER — Encounter: Payer: Self-pay | Admitting: Adult Health

## 2016-01-23 ENCOUNTER — Telehealth: Payer: Self-pay | Admitting: Adult Health

## 2016-01-23 DIAGNOSIS — E559 Vitamin D deficiency, unspecified: Secondary | ICD-10-CM

## 2016-01-23 DIAGNOSIS — R768 Other specified abnormal immunological findings in serum: Secondary | ICD-10-CM | POA: Insufficient documentation

## 2016-01-23 HISTORY — DX: Other specified abnormal immunological findings in serum: R76.8

## 2016-01-23 HISTORY — DX: Vitamin D deficiency, unspecified: E55.9

## 2016-01-23 MED ORDER — CHOLECALCIFEROL 125 MCG (5000 UT) PO CAPS: 5000.0000 [IU] | ORAL_CAPSULE | Freq: Every day | ORAL | Status: DC

## 2016-01-23 NOTE — Telephone Encounter (Signed)
Pt aware of labs, increase activity, decrease carbs take vitamin D3 5000 iu daily and pt aware HSV2 antibodies +

## 2016-01-24 ENCOUNTER — Ambulatory Visit (INDEPENDENT_AMBULATORY_CARE_PROVIDER_SITE_OTHER): Payer: BLUE CROSS/BLUE SHIELD

## 2016-01-24 DIAGNOSIS — Z975 Presence of (intrauterine) contraceptive device: Secondary | ICD-10-CM

## 2016-01-24 DIAGNOSIS — R1032 Left lower quadrant pain: Secondary | ICD-10-CM | POA: Diagnosis not present

## 2016-01-24 DIAGNOSIS — N854 Malposition of uterus: Secondary | ICD-10-CM | POA: Diagnosis not present

## 2016-01-24 NOTE — Progress Notes (Signed)
PELVIC TA/TV: Homogenous anteverted uterus,IUD is centrally located w/in the endometrium,EEC 6.757mm,normal left ov(limited view),unable to see rt ov because of bowel gas,no free fluid,no pain during ultrasound

## 2016-01-25 ENCOUNTER — Telehealth: Payer: Self-pay | Admitting: Adult Health

## 2016-01-25 NOTE — Telephone Encounter (Signed)
Number not in service.

## 2016-01-30 ENCOUNTER — Telehealth: Payer: Self-pay | Admitting: Adult Health

## 2016-01-30 NOTE — Telephone Encounter (Signed)
Pt aware of US, has appt in am

## 2016-01-30 NOTE — Telephone Encounter (Signed)
Left message to call me.

## 2016-01-31 ENCOUNTER — Ambulatory Visit: Payer: BLUE CROSS/BLUE SHIELD | Admitting: Adult Health

## 2016-02-14 ENCOUNTER — Ambulatory Visit: Payer: BLUE CROSS/BLUE SHIELD | Admitting: Adult Health

## 2017-02-09 IMAGING — NM NM HEPATO W/GB/PHARM/[PERSON_NAME]
2 series · 12 of 12 positions shown · non-contrast
Comparison: Abdominal ultrasound dated 09/22/2014

CLINICAL DATA: Right upper quadrant pain. Vomiting and nausea and
the collapsed.

EXAM:
NUCLEAR MEDICINE HEPATOBILIARY IMAGING WITH GALLBLADDER EF
TECHNIQUE: Sequential images of the abdomen were obtained [DATE] minutes
following intravenous administration of radiopharmaceutical. After
slow intravenous infusion of 2.77 micrograms Cholecystokinin,
gallbladder ejection fraction was determined.
RADIOPHARMACEUTICALS:  5 mCi Sc-BBm Choletec IV

[Series 1: biliary · 3.25mm/px · 6 of 60 frames shown]
[frame 6/60]
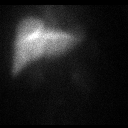
[frame 16/60]
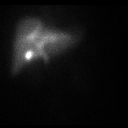
[frame 26/60]
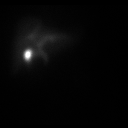
[frame 36/60]
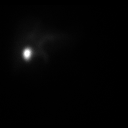
[frame 46/60]
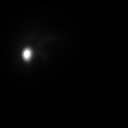
[frame 56/60]
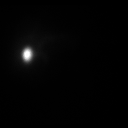

[Series 2: gbef · 3.25mm/px · 6 of 60 frames shown]
[frame 6/60]
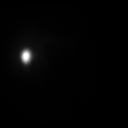
[frame 16/60]
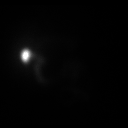
[frame 26/60]
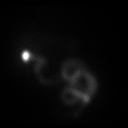
[frame 36/60]
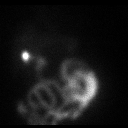
[frame 46/60]
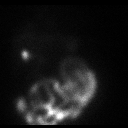
[frame 56/60]
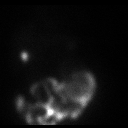

[12 of 12 positions shown; findings below may reference images not displayed]

FINDINGS: The gallbladder and bile ducts are visible at 15 minutes. Ejection
fraction at 45 minutes was 97%. At 45 min, normal ejection fraction
is greater than 40%.
IMPRESSION: Normal hepatobiliary scan with normal ejection fraction.

## 2017-04-18 ENCOUNTER — Other Ambulatory Visit: Payer: Self-pay | Admitting: Nurse Practitioner

## 2017-08-06 ENCOUNTER — Ambulatory Visit: Payer: 59 | Admitting: Family Medicine

## 2017-08-06 ENCOUNTER — Encounter: Payer: Self-pay | Admitting: Family Medicine

## 2017-08-06 VITALS — Temp 98.2°F | Ht 63.0 in | Wt 321.2 lb

## 2017-08-06 DIAGNOSIS — J019 Acute sinusitis, unspecified: Secondary | ICD-10-CM

## 2017-08-06 DIAGNOSIS — B349 Viral infection, unspecified: Secondary | ICD-10-CM | POA: Diagnosis not present

## 2017-08-06 DIAGNOSIS — J209 Acute bronchitis, unspecified: Secondary | ICD-10-CM | POA: Diagnosis not present

## 2017-08-06 MED ORDER — ALBUTEROL SULFATE HFA 108 (90 BASE) MCG/ACT IN AERS: 2.0000 | INHALATION_SPRAY | RESPIRATORY_TRACT | 2 refills | Status: AC | PRN

## 2017-08-06 MED ORDER — DOXYCYCLINE HYCLATE 100 MG PO TABS: 100.0000 mg | ORAL_TABLET | Freq: Two times a day (BID) | ORAL | 0 refills | Status: DC

## 2017-08-06 MED ORDER — RIZATRIPTAN BENZOATE 10 MG PO TABS: 10.0000 mg | ORAL_TABLET | ORAL | 5 refills | Status: DC | PRN

## 2017-08-06 NOTE — Progress Notes (Signed)
   Subjective:    Patient ID: Monica DuckingCasey D Horton, female    DOB: [birth year], 37 y.o.   MRN: [id number]  Cough  This is a new problem. The current episode started 1 to 4 weeks ago. The problem has been gradually worsening. The problem occurs constantly. The cough is productive of purulent sputum. Associated symptoms include chest pain, a fever, nasal congestion and rhinorrhea. Pertinent negatives include no chills, ear congestion, ear pain, headaches, hemoptysis, postnasal drip or shortness of breath. The symptoms are aggravated by dust. She has tried OTC cough suppressant for the symptoms. The treatment provided mild relief.   Patient overall was doing well before this hit her Patient working hard at trying to lose weight been difficult watching portions exercising  Review of Systems  Constitutional: Positive for fever. Negative for chills.  HENT: Positive for rhinorrhea. Negative for ear pain and postnasal drip.   Respiratory: Positive for cough. Negative for hemoptysis and shortness of breath.   Cardiovascular: Positive for chest pain.  Neurological: Negative for headaches.       Objective:   Physical Exam  Constitutional: She appears well-developed.  HENT:  Head: Normocephalic.  Right Ear: External ear normal.  Left Ear: External ear normal.  Nose: Nose normal.  Mouth/Throat: Oropharynx is clear and moist. No oropharyngeal exudate.  Eyes: Right eye exhibits no discharge. Left eye exhibits no discharge.  Neck: Neck supple. No tracheal deviation present.  Cardiovascular: Normal rate and normal heart sounds.  No murmur heard. Pulmonary/Chest: Effort normal and breath sounds normal. She has no wheezes. She has no rales.  Lymphadenopathy:    She has no cervical adenopathy.  Skin: Skin is warm and dry.  Nursing note and vitals reviewed.         Assessment & Plan:  Viral syndrome Sinusitis Sieckman 10 days as directed Ventolin as needed If progressive troubles or if worse  follow-up Continue to try to lose weight keep regular health visits

## 2017-12-17 ENCOUNTER — Encounter: Payer: Self-pay | Admitting: Nurse Practitioner

## 2017-12-17 ENCOUNTER — Ambulatory Visit: Payer: 59 | Admitting: Nurse Practitioner

## 2017-12-17 VITALS — BP 122/88 | Temp 98.2°F | Ht 63.0 in | Wt 315.0 lb

## 2017-12-17 DIAGNOSIS — J069 Acute upper respiratory infection, unspecified: Secondary | ICD-10-CM

## 2017-12-17 DIAGNOSIS — J029 Acute pharyngitis, unspecified: Secondary | ICD-10-CM | POA: Diagnosis not present

## 2017-12-17 DIAGNOSIS — B9689 Other specified bacterial agents as the cause of diseases classified elsewhere: Secondary | ICD-10-CM

## 2017-12-17 MED ORDER — LEVOFLOXACIN 500 MG PO TABS: 500.0000 mg | ORAL_TABLET | Freq: Every day | ORAL | 0 refills | Status: DC

## 2017-12-17 MED ORDER — PREDNISONE 20 MG PO TABS: ORAL_TABLET | ORAL | 0 refills | Status: DC

## 2017-12-18 ENCOUNTER — Encounter: Payer: Self-pay | Admitting: Nurse Practitioner

## 2017-12-18 NOTE — Progress Notes (Signed)
Subjective:  Presents for recheck after an e visit with urgent care. Finishes a Zpack today. Started about a week ago. Began with white patches on her throat with difficulty swallowing. Still having some sore throat and hoarseness but better. Fever better. Having her "ususal" headaches. Began having a "bad" cough yesterday, non productive. Ear pain. Slight wheezing. Used her albuterol last night. Has sensitivities to multiple antibiotics.  Off-and-on malaise.  No vomiting diarrhea or abdominal pain.  Objective:   BP 122/88   Temp 98.2 F (36.8 C) (Oral)   Ht 5\' 3"  (1.6 m)   Wt (!) 315 lb (142.9 kg)   BMI 55.80 kg/m  NAD. Alert, oriented. TMs clear effusion, no erythema. Pharynx mildly injected with PND noted. Neck supple with moderate anterior adenopathy. No posterior adenopathy noted.  Lungs clear.  Occasional nonproductive cough noted.  Heart regular rate and rhythm.   Assessment:  Bacterial upper respiratory infection  Acute pharyngitis, unspecified etiology    Plan:   Meds ordered this encounter  Medications  . levofloxacin (LEVAQUIN) 500 MG tablet    Sig: Take 1 tablet (500 mg total) by mouth daily.    Dispense:  10 tablet    Refill:  0    Order Specific Question:   Supervising Provider    Answer:   Merlyn AlbertLUKING, WILLIAM S [[id number]]  . predniSONE (DELTASONE) 20 MG tablet    Sig: 3 po qd x 3 d then 2 po qd x 3 d then 1 po qd x 2 d    Dispense:  17 tablet    Refill:  0    Order Specific Question:   Supervising Provider    Answer:   Merlyn AlbertLUKING, WILLIAM S [[id number]]   OTC meds as directed for congestion and cough.  Warning signs reviewed.  Call back in 4 to 5 days if no improvement, sooner if worse.

## 2018-07-30 ENCOUNTER — Ambulatory Visit (INDEPENDENT_AMBULATORY_CARE_PROVIDER_SITE_OTHER): Payer: BLUE CROSS/BLUE SHIELD | Admitting: *Deleted

## 2018-07-30 DIAGNOSIS — Z23 Encounter for immunization: Secondary | ICD-10-CM | POA: Diagnosis not present

## 2018-09-06 ENCOUNTER — Other Ambulatory Visit: Payer: Self-pay | Admitting: Family Medicine

## 2019-02-17 ENCOUNTER — Other Ambulatory Visit: Payer: Self-pay

## 2019-02-18 ENCOUNTER — Ambulatory Visit (INDEPENDENT_AMBULATORY_CARE_PROVIDER_SITE_OTHER): Payer: Self-pay | Admitting: Nurse Practitioner

## 2019-02-18 DIAGNOSIS — M62830 Muscle spasm of back: Secondary | ICD-10-CM
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
## 2019-02-19 ENCOUNTER — Encounter: Payer: Self-pay | Admitting: Nurse Practitioner

## 2019-02-19 MED ORDER — METHOCARBAMOL 500 MG PO TABS: 500.0000 mg | ORAL_TABLET | Freq: Three times a day (TID) | ORAL | 0 refills | Status: DC | PRN

## 2019-02-19 MED ORDER — LIDOCAINE 5 % EX PTCH: MEDICATED_PATCH | CUTANEOUS | 0 refills | Status: DC

## 2019-02-19 MED ORDER — PREDNISONE 20 MG PO TABS: ORAL_TABLET | ORAL | 0 refills | Status: DC

## 2019-02-21 ENCOUNTER — Telehealth: Payer: Self-pay | Admitting: Nurse Practitioner

## 2019-02-21 NOTE — Telephone Encounter (Signed)
PA sent to plan for Lidocaine patches 5%. Sent to plan 02/21/2019; [employer] states that it may take up to 3 days to approve or deny.

## 2019-02-23 NOTE — Telephone Encounter (Signed)
Lidocaine 5% patches denied due to member not being treated for pain from shingles. Please advise. Thank you

## 2019-02-24 NOTE — Telephone Encounter (Signed)
The next best thing is Lidocaine patches over the counter. Use as directed. Thanks.

## 2019-02-25 NOTE — Telephone Encounter (Signed)
Patient notified and verbalized understanding. 

## 2019-02-25 NOTE — Telephone Encounter (Signed)
Left message to return call 

## 2019-03-02 ENCOUNTER — Telehealth: Payer: Self-pay | Admitting: Nurse Practitioner

## 2019-03-02 NOTE — Telephone Encounter (Signed)
Pt returned call. Pt states she stopped taking the prednisone and the muscle spasms returned. Pt states that the prednisone helped more than the muscle relaxers.  (If pt needs to be called back, please call on cell phone [phone])

## 2019-03-02 NOTE — Telephone Encounter (Signed)
A previous message has been sent, I apologize. I did leave a message for patient to return call

## 2019-03-02 NOTE — Telephone Encounter (Signed)
Fax from [facility] stating that lidocaine 5% patches have been denied due to member not being treated for shingles. Please advise. Thank you

## 2019-03-02 NOTE — Telephone Encounter (Signed)
I think I sent a previous note. She can try OTC Lidocaine patches as directed. Thanks.

## 2019-03-04 ENCOUNTER — Other Ambulatory Visit: Payer: Self-pay | Admitting: Nurse Practitioner

## 2019-03-04 MED ORDER — DICLOFENAC SODIUM 75 MG PO TBEC: 75.0000 mg | DELAYED_RELEASE_TABLET | Freq: Two times a day (BID) | ORAL | 0 refills | Status: DC

## 2019-03-04 NOTE — Telephone Encounter (Signed)
Prednisone was a one time short term solution. Now we can switch to high dose NSAID which will also help inflammation. Would she like for me to call this in? Thanks.

## 2019-03-04 NOTE — Telephone Encounter (Signed)
Pt returned call. Pt states that she would like a high dose NSAID called in. Pt states she is still taking her muscle relaxer but only as needed. Pt states she does not need a call back when the med is sent in. Please advise. Thank you  [facility]

## 2019-03-04 NOTE — Telephone Encounter (Signed)
Left message to return call 

## 2019-03-04 NOTE — Telephone Encounter (Signed)
Done

## 2019-06-09 ENCOUNTER — Other Ambulatory Visit (INDEPENDENT_AMBULATORY_CARE_PROVIDER_SITE_OTHER): Payer: Self-pay | Admitting: *Deleted

## 2019-06-09 ENCOUNTER — Other Ambulatory Visit: Payer: Self-pay

## 2019-06-09 DIAGNOSIS — Z23 Encounter for immunization: Secondary | ICD-10-CM

## 2019-08-22 ENCOUNTER — Encounter: Payer: Self-pay | Admitting: Family Medicine

## 2020-09-12 ENCOUNTER — Other Ambulatory Visit: Payer: Self-pay

## 2020-09-12 ENCOUNTER — Ambulatory Visit: Payer: BC Managed Care – PPO | Admitting: Family Medicine

## 2020-09-12 ENCOUNTER — Encounter: Payer: Self-pay | Admitting: Family Medicine

## 2020-09-12 VITALS — BP 122/82 | HR 101 | Temp 97.6°F | Ht 64.0 in | Wt 355.4 lb

## 2020-09-12 DIAGNOSIS — M546 Pain in thoracic spine: Secondary | ICD-10-CM

## 2020-09-12 DIAGNOSIS — G43009 Migraine without aura, not intractable, without status migrainosus: Secondary | ICD-10-CM | POA: Diagnosis not present

## 2020-09-12 MED ORDER — RIZATRIPTAN BENZOATE 10 MG PO TABS: 10.0000 mg | ORAL_TABLET | ORAL | 5 refills | Status: DC | PRN

## 2020-09-12 MED ORDER — PREDNISONE 20 MG PO TABS: ORAL_TABLET | ORAL | 0 refills | Status: DC

## 2020-09-12 MED ORDER — DICLOFENAC SODIUM 75 MG PO TBEC: 75.0000 mg | DELAYED_RELEASE_TABLET | Freq: Two times a day (BID) | ORAL | 0 refills | Status: DC

## 2020-09-12 MED ORDER — METHOCARBAMOL 500 MG PO TABS: 500.0000 mg | ORAL_TABLET | Freq: Three times a day (TID) | ORAL | 0 refills | Status: DC | PRN
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
## 2021-11-26 ENCOUNTER — Ambulatory Visit: Payer: BC Managed Care – PPO | Admitting: Family Medicine

## 2021-11-26 VITALS — BP 138/88 | HR 82 | Ht 63.0 in | Wt 358.0 lb

## 2021-11-26 DIAGNOSIS — M5441 Lumbago with sciatica, right side: Secondary | ICD-10-CM | POA: Diagnosis not present

## 2021-11-26 DIAGNOSIS — M5442 Lumbago with sciatica, left side: Secondary | ICD-10-CM

## 2021-11-26 DIAGNOSIS — G8929 Other chronic pain: Secondary | ICD-10-CM | POA: Diagnosis not present

## 2021-11-26 MED ORDER — PREDNISONE 10 MG PO TABS: ORAL_TABLET | ORAL | 0 refills | Status: DC

## 2021-11-26 MED ORDER — METHOCARBAMOL 500 MG PO TABS: 500.0000 mg | ORAL_TABLET | Freq: Three times a day (TID) | ORAL | 1 refills | Status: DC | PRN
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
## 2021-11-26 NOTE — Patient Instructions (Signed)
Consider xray of the lumbar spine. I recommend this given the chronicity of your pain. ? ?I would also recommend rountine labs. ? ?Medications as prescribed. ? ?Take care ? ?Dr. Adriana Simas  ?

## 2021-11-26 NOTE — Assessment & Plan Note (Signed)
Patient with acute exacerbation of chronic low back pain.  Recommended x-ray imaging and also recommend patient have basic labs as she has not had laboratory studies in quite some time.  Patient declined.  Treating with prednisone and Robaxin.  Patient is working on weight loss. ?

## 2022-01-08 ENCOUNTER — Encounter: Payer: BC Managed Care – PPO | Admitting: Adult Health

## 2022-05-05 ENCOUNTER — Encounter: Payer: Self-pay | Admitting: Family Medicine

## 2022-05-05 ENCOUNTER — Ambulatory Visit (INDEPENDENT_AMBULATORY_CARE_PROVIDER_SITE_OTHER): Payer: BC Managed Care – PPO | Admitting: Family Medicine

## 2022-05-05 VITALS — BP 118/60 | Temp 97.9°F | Wt 351.8 lb

## 2022-05-05 DIAGNOSIS — G8929 Other chronic pain: Secondary | ICD-10-CM | POA: Diagnosis not present

## 2022-05-05 DIAGNOSIS — M5440 Lumbago with sciatica, unspecified side: Secondary | ICD-10-CM | POA: Diagnosis not present

## 2022-05-05 DIAGNOSIS — F4321 Adjustment disorder with depressed mood: Secondary | ICD-10-CM | POA: Diagnosis not present

## 2022-05-05 MED ORDER — BACLOFEN 10 MG PO TABS: 10.0000 mg | ORAL_TABLET | Freq: Three times a day (TID) | ORAL | 0 refills | Status: AC | PRN

## 2022-05-05 MED ORDER — PREDNISONE 10 MG PO TABS: ORAL_TABLET | ORAL | 0 refills | Status: AC

## 2022-05-05 NOTE — Patient Instructions (Signed)
Medications as prescribed.  Xray when you can.  Take care  Dr. Lacinda Axon

## 2022-05-06 DIAGNOSIS — F4321 Adjustment disorder with depressed mood: Secondary | ICD-10-CM | POA: Insufficient documentation

## 2022-05-06 NOTE — Assessment & Plan Note (Signed)
Acute exacerbation of chronic low back pain.  X-ray imaging for further evaluation.  Treating with baclofen and prednisone.

## 2022-05-06 NOTE — Assessment & Plan Note (Signed)
Patient experiencing grief over the loss of her father.  Recommended talking with a counselor or pastor.  Patient declines referral today.  She states that she will speak to pastor.

## 2022-05-06 NOTE — Progress Notes (Signed)
Subjective:  Patient ID: Monica Horton, female    DOB: 06/26/1981  Age: 41 y.o. MRN: [id number]  CC: Chief Complaint  Patient presents with   Back Pain    Pt has been cleaning out dads house and picked up something to heavy. Going on since August. Pt states she has been doing exercises but no help. Having muscle spasms back to back. Pt has bad disc in back     HPI:  41 year old female with morbid obesity and chronic back pain presents with recent worsening back pain.  Patient is very upset today.  Patient has recently lost her father.  She states that she was cleaning out his house and moving boxes and subsequently developed right-sided low back pain.  This occurred in August.  He has had ongoing pain since then.  Muscle laxer has not helped.  She reports some occasional tingling in her right lower extremity.  Patient Active Problem List   Diagnosis Date Noted   Grief 05/06/2022   Chronic right-sided low back pain with sciatica 11/26/2021   Vitamin D deficiency 01/23/2016   HSV-2 seropositive 01/23/2016   Morbid obesity ([place]) 04/09/2015   Asthma 10/21/2012   Migraines 10/21/2012    Social Hx   Social History   Socioeconomic History   Marital status: Single    Spouse name: Not on file   Number of children: Not on file   Years of education: Not on file   Highest education level: Not on file  Occupational History   Not on file  Tobacco Use   Smoking status: Former    Types: Cigarettes   Smokeless tobacco: Never  Substance and Sexual Activity   Alcohol use: Yes    Comment: occ   Drug use: No   Sexual activity: Yes    Birth control/protection: I.U.D.  Other Topics Concern   Not on file  Social History Narrative   Not on file   Social Determinants of Health   Financial Resource Strain: Not on file  Food Insecurity: Not on file  Transportation Needs: Not on file  Physical Activity: Not on file  Stress: Not on file  Social Connections: Not on file    Review  of Systems Per HPI  Objective:  BP 118/60   Temp 97.9 F (36.6 C)   Wt (!) 351 lb 12.8 oz (159.6 kg)   BMI 62.32 kg/m      05/05/2022    1:45 PM 11/26/2021    1:47 PM 09/12/2020    3:06 PM  BP/Weight  Systolic BP 409 811 914  Diastolic BP 60 88 82  Wt. (Lbs) 351.8 358 355.4  BMI 62.32 kg/m2 63.42 kg/m2 61 kg/m2    Physical Exam Constitutional:      Appearance: Normal appearance. She is obese.  HENT:     Head: Normocephalic and atraumatic.  Cardiovascular:     Rate and Rhythm: Normal rate and regular rhythm.  Pulmonary:     Effort: Pulmonary effort is normal.     Breath sounds: Normal breath sounds.  Musculoskeletal:     Comments: Right paraspinal musculature of the lumbar spine with tenderness to palpation.  Neurological:     Mental Status: She is alert.  Psychiatric:     Comments: Upset, crying.     Lab Results  Component Value Date   WBC 5.1 01/17/2016   HGB 14.2 01/17/2016   HCT 43.7 01/17/2016   PLT 173 01/17/2016   GLUCOSE 109 (H) 01/17/2016   CHOL  145 01/17/2016   TRIG 166 (H) 01/17/2016   HDL 25 (L) 01/17/2016   LDLCALC 87 01/17/2016   ALT 21 01/17/2016   AST 17 01/17/2016   NA 140 01/17/2016   K 4.6 01/17/2016   CL 102 01/17/2016   CREATININE 0.68 01/17/2016   BUN 12 01/17/2016   CO2 22 01/17/2016   TSH 2.740 01/17/2016   HGBA1C 5.3 01/17/2016     Assessment & Plan:   Problem List Items Addressed This Visit       Nervous and Auditory   Chronic right-sided low back pain with sciatica - Primary    Acute exacerbation of chronic low back pain.  X-ray imaging for further evaluation.  Treating with baclofen and prednisone.      Relevant Medications   predniSONE (DELTASONE) 10 MG tablet   baclofen (LIORESAL) 10 MG tablet   Other Relevant Orders   DG Lumbar Spine Complete     Other   Grief    Patient experiencing grief over the loss of her father.  Recommended talking with a counselor or pastor.  Patient declines referral today.  She  states that she will speak to pastor.       Meds ordered this encounter  Medications   predniSONE (DELTASONE) 10 MG tablet    Sig: 50 mg daily x 2 days, then 40 mg daily x 2 days, then 30 mg daily x 2 days, then 20 mg daily x 2 days, then 10 mg daily x 2 days.    Dispense:  30 tablet    Refill:  0   baclofen (LIORESAL) 10 MG tablet    Sig: Take 1 tablet (10 mg total) by mouth 3 (three) times daily as needed for muscle spasms.    Dispense:  30 each    Refill:  0    Follow-up:  Return if symptoms worsen or fail to improve.  Everlene Other DO [facility]

## 2022-05-23 DIAGNOSIS — J019 Acute sinusitis, unspecified: Secondary | ICD-10-CM | POA: Diagnosis not present

## 2022-07-16 DIAGNOSIS — J4521 Mild intermittent asthma with (acute) exacerbation: Secondary | ICD-10-CM | POA: Diagnosis not present
